# Patient Record
Sex: Male | Born: 1960 | Race: White | Hispanic: No | Marital: Married | Smoking: Former smoker
Health system: Southern US, Community
[De-identification: ages and names within clinical notes are randomized; demographics above are authoritative.]

## PROBLEM LIST (undated history)

## (undated) DIAGNOSIS — N200 Calculus of kidney: Secondary | ICD-10-CM

## (undated) DIAGNOSIS — J45909 Unspecified asthma, uncomplicated: Secondary | ICD-10-CM

## (undated) HISTORY — PX: CYSTOSCOPY W/ STONE MANIPULATION: SHX1427

## (undated) HISTORY — PX: ANKLE SURGERY: SHX546

---

## 1998-04-08 ENCOUNTER — Emergency Department (HOSPITAL_COMMUNITY): Admission: EM | Admit: 1998-04-08 | Discharge: 1998-04-08 | Payer: Self-pay | Admitting: Emergency Medicine

## 2001-08-04 ENCOUNTER — Encounter: Payer: Self-pay | Admitting: Emergency Medicine

## 2001-08-04 ENCOUNTER — Emergency Department (HOSPITAL_COMMUNITY): Admission: EM | Admit: 2001-08-04 | Discharge: 2001-08-04 | Payer: Self-pay | Admitting: Emergency Medicine

## 2001-08-09 ENCOUNTER — Encounter: Payer: Self-pay | Admitting: Urology

## 2001-08-09 ENCOUNTER — Ambulatory Visit (HOSPITAL_COMMUNITY): Admission: RE | Admit: 2001-08-09 | Discharge: 2001-08-09 | Payer: Self-pay | Admitting: Urology

## 2001-08-12 ENCOUNTER — Encounter: Payer: Self-pay | Admitting: Urology

## 2001-08-12 ENCOUNTER — Encounter: Admission: RE | Admit: 2001-08-12 | Discharge: 2001-08-12 | Payer: Self-pay | Admitting: Urology

## 2001-08-18 ENCOUNTER — Encounter: Payer: Self-pay | Admitting: Urology

## 2001-08-20 ENCOUNTER — Ambulatory Visit (HOSPITAL_COMMUNITY): Admission: RE | Admit: 2001-08-20 | Discharge: 2001-08-20 | Payer: Self-pay | Admitting: Urology

## 2001-08-27 ENCOUNTER — Encounter: Admission: RE | Admit: 2001-08-27 | Discharge: 2001-08-27 | Payer: Self-pay | Admitting: Urology

## 2001-08-27 ENCOUNTER — Encounter: Payer: Self-pay | Admitting: Urology

## 2007-01-11 ENCOUNTER — Emergency Department (HOSPITAL_COMMUNITY): Admission: EM | Admit: 2007-01-11 | Discharge: 2007-01-11 | Payer: Self-pay | Admitting: Emergency Medicine

## 2007-01-14 ENCOUNTER — Ambulatory Visit (HOSPITAL_BASED_OUTPATIENT_CLINIC_OR_DEPARTMENT_OTHER): Admission: RE | Admit: 2007-01-14 | Discharge: 2007-01-14 | Payer: Self-pay | Admitting: Urology

## 2007-01-15 ENCOUNTER — Emergency Department (HOSPITAL_COMMUNITY): Admission: EM | Admit: 2007-01-15 | Discharge: 2007-01-15 | Payer: Self-pay | Admitting: Emergency Medicine

## 2007-04-12 ENCOUNTER — Ambulatory Visit (HOSPITAL_COMMUNITY): Admission: RE | Admit: 2007-04-12 | Discharge: 2007-04-12 | Payer: Self-pay | Admitting: Urology

## 2008-04-25 ENCOUNTER — Emergency Department: Payer: Self-pay | Admitting: Emergency Medicine

## 2008-08-17 ENCOUNTER — Ambulatory Visit (HOSPITAL_COMMUNITY): Admission: RE | Admit: 2008-08-17 | Discharge: 2008-08-17 | Payer: Self-pay | Admitting: Orthopedic Surgery

## 2009-04-28 ENCOUNTER — Ambulatory Visit: Payer: Self-pay | Admitting: Diagnostic Radiology

## 2009-04-28 ENCOUNTER — Emergency Department (HOSPITAL_BASED_OUTPATIENT_CLINIC_OR_DEPARTMENT_OTHER): Admission: EM | Admit: 2009-04-28 | Discharge: 2009-04-28 | Payer: Self-pay | Admitting: Emergency Medicine

## 2010-06-13 ENCOUNTER — Emergency Department (HOSPITAL_BASED_OUTPATIENT_CLINIC_OR_DEPARTMENT_OTHER): Admission: EM | Admit: 2010-06-13 | Discharge: 2009-08-23 | Payer: Self-pay | Admitting: Emergency Medicine

## 2010-11-08 ENCOUNTER — Emergency Department: Payer: Self-pay | Admitting: Emergency Medicine

## 2010-11-19 NOTE — Op Note (Signed)
NAME:  Timothy Sexton, Timothy Sexton NO.:  [id number]   MEDICAL RECORD NO.:  [id number]          PATIENT TYPE:  AMB   LOCATION:  [facility]                         FACILITY:  [facility]   PHYSICIAN:  Jamison Neighbor, M.D.  DATE OF BIRTH:  [birth year]   DATE OF PROCEDURE:  01/14/2007  DATE OF DISCHARGE:                               OPERATIVE REPORT   PREOPERATIVE DIAGNOSIS:  Distal right ureteral calculus.   POSTOPERATIVE DIAGNOSIS:  Passed right ureteral calculus.   PROCEDURE:  Cystoscopy, right retrograde and right ureteroscopy.   SURGEON:  Jamison Neighbor, M.D.   ANESTHESIA:  General.   COMPLICATIONS:  None.   DRAIN:  None.   BRIEF HISTORY:  This 50 year old male and was found to have pain on the  right-hand side and plain x-rays showed a stone in the right kidney but  no evidence of stone in the ureter, however, the patient's pain seemed  to be well in excess of what one would expected from lower pole  calculus, so a CT scan was ordered.  The CT scan did have some partial  obstruction on the right-hand side due to a distal ureteral calculus.  The patient stated he felt a little bit better and had passed some  gravel but he was concerned that the stone did not seem to have  completely passed.  The patient is now to undergo diagnostic cystoscopy,  retrogrades and ureteroscopy.  He understands the risks and benefits of  the procedure and gave full informed consent.   PROCEDURE:  After successful induction of general anesthesia the patient  was placed in the dorsal lithotomy position, prepped with Betadine and  draped in the usual sterile fashion.  Cystoscopy was performed, urethra  visualized in its entirety and found to be normal.  Beyond the  verumontanum, the patient's prostatic fossa was opened with no signs  obstruction.  The bladder was carefully inspected, both ureteral  orifices were normal in configuration and location.  No tumors or stones  could be seen.  A 6-French  ureteral catheter was inserted to the right  ureter in preparation for a retrograde studies.   Right retrograde ureteropyelography showed a somewhat tight distal  ureter and then opened up to a moderately dilated ureter.  There were no  filling defects seen in the distal ureter.  The ureter widened out to  otherwise normal looking collecting system with minimal hydronephrosis  and no filling defects seen.  The patient had a guidewire placed and the  ureteral catheters removed.   Upon completion of the diagnostic retrograde a ureteroscope was inserted  along the ureter and was passed up to the proximal level of the SI  joint.  The more proximal ureter could be easily visualized in and there  was no signs of any obstruction in this area.  It appeared the patient  most likely have passed all of the stone material.  The ureter was  visualized carefully as the ureteroscope was withdrawn and no stone  material, stricture, obstruction could be seen.  It was not felt that  the patient would require a stent.  The ureteroscope was removed.  The  bladder was drained.  The  patient tolerated procedure well, was taken to the recovery room  condition.  He will be sent out with pain medication and also take his  Flomax as needed.  We will offer him the possibly of a 24-hour urine  study.  He will also be given the option of having the right kidney  treated with ESWL.      Jamison Neighbor, M.D.  Electronically Signed     RJE/MEDQ  D:  01/14/2007  T:  01/15/2007  Job:  [id number]

## 2010-11-19 NOTE — Op Note (Signed)
NAME:  JHACE, FENNELL NO.:  [id number]   MEDICAL RECORD NO.:  [id number]          PATIENT TYPE:  AMB   LOCATION:  DAY                          FACILITY:  [facility]   PHYSICIAN:  Jamison Neighbor, M.D.  DATE OF BIRTH:  Aug 12, 1960   DATE OF PROCEDURE:  04/12/2007  DATE OF DISCHARGE:                               OPERATIVE REPORT   PREOPERATIVE DIAGNOSIS:  Left ureteral calculus.   POSTOPERATIVE DIAGNOSIS:  Left ureteral calculus.   PROCEDURE:  Cystoscopy, left retrograde, left ureteroscopy, left basket  extraction.   SURGEON:  Jamison Neighbor, M.D.   ANESTHESIA:  General.   COMPLICATIONS:  None.   DRAINS:  None.   BRIEF HISTORY:  This 49 year old male presented to the office today with  left-sided flank pain.  A KUB was obtained, but it was not a clear-cut  stone identified.  He did, however, appear to have clear-cut pain and  marked hematuria and is now to undergo retrograde studies, ureteroscopy  and basket extraction.  He understands the risks and benefits of the  procedure and gave full informed consent.   PROCEDURE:  After successful induction of general anesthesia, the  patient was placed in the dorsal position, prepped with Betadine and  draped in the usual sterile fashion.  Cystoscopy was performed.  Urethra  was visualized in its entirety and was found to be normal.  Beyond the  verumontanum, there was some prostatic enlargement including a little  bit of the median lobe extending into the bladder.  The bladder was  carefully inspected.  No tumors or stones could be seen.  Both ureteral  orifices were normal in configuration and location.  No irregularities  of the mucosa could be identified.  A ureteral catheter was inserted  into the left ureteral orifice and a retrograde study was performed.   Retrograde ureteral pyelography was performed through a 6-French  ureteral catheter.  Contrast was seen to fill the distal ureter, which  was normal.  At  the junction of the middle and distal third of the  ureter, a filling defect could be identified; above that, there was  moderate dilation of the ureter all way up into the kidney.  There were  no other filling defects seen and no other irregularities detected.  The  guidewire was passed up to the kidney following completion of the  retrograde.   After completion of the retrograde study, the ureteroscope was inserted.  This was advanced under direct vision until the stone could be  identified.  The stone was grasped with the basket and extracted under  direct vision.  The stone will be sent for analysis.  The ureteroscope  was reintroduced and passed up to the area and beyond.  There was no  evidence of any mucosal injury or significant irregularities of the  mucosa and there was no evidence of ureteral damage.  It was felt that  the patient did not require a stent.  The urethroscope was removed.  The  patient tolerated the procedure well and was taken to the recovery room  in good condition.  Jamison Neighbor, M.D.  Electronically Signed     RJE/MEDQ  D:  04/12/2007  T:  04/13/2007  Job:  [id number]

## 2010-11-22 NOTE — Op Note (Signed)
[facility]  Patient:    Timothy Sexton, Timothy Sexton Visit Number: [id number] MRN: [id number]          Service Type: DSU Location: DAY Attending Physician:  Londell Moh Dictated by:   Jamison Neighbor, M.D. Proc. Date: 08/20/01 Admit Date:  08/20/2001   CC:         Dr. ________________                           Operative Report  PREOPERATIVE DIAGNOSIS:  Left ureteral calculus.  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  PROCEDURE:  Cystoscopy left retrograde, left ureteroscopy including flexible evaluation of upper tract, left double J catheter insertion.  SURGEON:  Jamison Neighbor, M.D.  ANESTHESIA:  General.  COMPLICATIONS:  None.  DRAINS:  A 7 French double J catheter.  BRIEF HISTORY:  This 50 year old male was found on CT scan to have a calcification in the ureter directly over the SI joint. The patient had several KUBs which did not show much in the way of stone movement. The patient has still had persistent pain and is now to undergo ureteroscopic extraction with possible in situ laser lithotripsy if indicated. The patient understands the risks and benefits of the procedure and gave full and informed consent.  DESCRIPTION OF PROCEDURE:  After successful induction of general anesthesia, the patient was placed in the dorsal lithotomy position and prepped with Betadine, draped in the usual sterile fashion. Cystoscopy was performed and the bladder was carefully inspected and was free of any tumor or stones. Both ureteral orifices were normal in configuration and location. The urethra itself was unremarkable. The prostate was minimally enlarged with a little bit of median lobe enlargement but no particular lateral lobe hypertrophy. A retrograde study performed on the left hand side showed what appeared to be some narrowing of the ureter in the area where the stone was supposed to be seen but no  obvious filling defect could be seen. A guidewire was passed up to the kidney. The distal ureter was dilated with a balloon dilator. The ureteroscope was inserted and that area was inspected. No stone could be seen. The ureteroscope could not be passed much beyond the distal ureter because of the iliac obstruction. Additional balloon dilation was performed, the ureteroscope was advanced along its entire length but the stone could not be seen. There was narrowing of the ureter in that area and it was felt that this was consistent with the location where the calcification had been seen, and it was felt that perhaps a stone had eroded out of the ureter and had been overgrown and developed into a stone granuloma. The decision was made to perform flexible ureteroscopy. Flexible ureteroscopy was done through the sheath and the entire upper tract was evaluated and no stone could be seen. The ureter was visualized in its entirety as that was withdrawn and the stone was not visualized. The double J catheter was passed up to the kidney and allowed to coil normally in the bladder and in the renal pelvis. The bladder was drained, a Urojet was inserted. A B&O suppository was inserted. The patient tolerated the procedure well and was taken to the recovery room in good condition. Dictated by:   Jamison Neighbor, M.D. Attending Physician:  Londell Moh DD:  08/20/01 TD:  08/20/01 Job: [id number]

## 2011-04-22 LAB — URINALYSIS, ROUTINE W REFLEX MICROSCOPIC
Ketones, ur: NEGATIVE
Nitrite: NEGATIVE
Nitrite: NEGATIVE
Protein, ur: 100 — AB
Specific Gravity, Urine: 1.028
Urobilinogen, UA: 0.2
Urobilinogen, UA: 1

## 2011-04-22 LAB — URINE MICROSCOPIC-ADD ON

## 2011-04-22 LAB — POCT HEMOGLOBIN-HEMACUE
Hemoglobin: 16.6
Operator id: 114531

## 2012-07-06 ENCOUNTER — Emergency Department (HOSPITAL_BASED_OUTPATIENT_CLINIC_OR_DEPARTMENT_OTHER): Payer: BC Managed Care – PPO

## 2012-07-06 ENCOUNTER — Encounter (HOSPITAL_BASED_OUTPATIENT_CLINIC_OR_DEPARTMENT_OTHER): Payer: Self-pay | Admitting: *Deleted

## 2012-07-06 ENCOUNTER — Observation Stay (HOSPITAL_BASED_OUTPATIENT_CLINIC_OR_DEPARTMENT_OTHER)
Admission: EM | Admit: 2012-07-06 | Discharge: 2012-07-07 | Disposition: A | Discharge status: Home / Self Care | Payer: BC Managed Care – PPO | Attending: Internal Medicine | Admitting: Internal Medicine

## 2012-07-06 DIAGNOSIS — R059 Cough, unspecified: Secondary | ICD-10-CM | POA: Insufficient documentation

## 2012-07-06 DIAGNOSIS — R000 Tachycardia, unspecified: Secondary | ICD-10-CM

## 2012-07-06 DIAGNOSIS — J45909 Unspecified asthma, uncomplicated: Secondary | ICD-10-CM | POA: Insufficient documentation

## 2012-07-06 DIAGNOSIS — R062 Wheezing: Secondary | ICD-10-CM | POA: Insufficient documentation

## 2012-07-06 DIAGNOSIS — J40 Bronchitis, not specified as acute or chronic: Secondary | ICD-10-CM | POA: Insufficient documentation

## 2012-07-06 DIAGNOSIS — Z87442 Personal history of urinary calculi: Secondary | ICD-10-CM

## 2012-07-06 DIAGNOSIS — R05 Cough: Secondary | ICD-10-CM | POA: Insufficient documentation

## 2012-07-06 DIAGNOSIS — R079 Chest pain, unspecified: Secondary | ICD-10-CM

## 2012-07-06 DIAGNOSIS — J101 Influenza due to other identified influenza virus with other respiratory manifestations: Principal | ICD-10-CM | POA: Insufficient documentation

## 2012-07-06 HISTORY — DX: Calculus of kidney: N20.0

## 2012-07-06 HISTORY — DX: Unspecified asthma, uncomplicated: J45.909

## 2012-07-06 LAB — COMPREHENSIVE METABOLIC PANEL
AST: 19 U/L (ref 0–37)
CO2: 23 mEq/L (ref 19–32)
Chloride: 102 mEq/L (ref 96–112)
Glucose, Bld: 113 mg/dL — ABNORMAL HIGH (ref 70–99)
Potassium: 3.8 mEq/L (ref 3.5–5.1)
Sodium: 139 mEq/L (ref 135–145)
Total Protein: 7.9 g/dL (ref 6.0–8.3)

## 2012-07-06 LAB — CBC WITH DIFFERENTIAL/PLATELET
Basophils Absolute: 0 10*3/uL (ref 0.0–0.1)
Basophils Relative: 0 % (ref 0–1)
Eosinophils Absolute: 0.1 10*3/uL (ref 0.0–0.7)
Eosinophils Relative: 1 % (ref 0–5)
Lymphocytes Relative: 15 % (ref 12–46)
MCHC: 33.8 g/dL (ref 30.0–36.0)
MCV: 85.5 fL (ref 78.0–100.0)
Monocytes Relative: 9 % (ref 3–12)
Neutrophils Relative %: 75 % (ref 43–77)
Platelets: 159 10*3/uL (ref 150–400)
RDW: 14.3 % (ref 11.5–15.5)

## 2012-07-06 LAB — RAPID STREP SCREEN (MED CTR MEBANE ONLY): Streptococcus, Group A Screen (Direct): NEGATIVE

## 2012-07-06 LAB — D-DIMER, QUANTITATIVE: D-Dimer, Quant: 0.36 ug/mL-FEU (ref 0.00–0.48)

## 2012-07-06 MED ORDER — PREDNISONE 50 MG PO TABS
60.0000 mg | ORAL_TABLET | Freq: Once | ORAL | Status: AC
  Administered 2012-07-06: 60 mg via ORAL
  Filled 2012-07-06: qty 1

## 2012-07-06 MED ORDER — PREDNISONE 50 MG PO TABS: 60.0000 mg | ORAL_TABLET | Freq: Every day | ORAL | Status: DC

## 2012-07-06 MED ORDER — ACETAMINOPHEN 325 MG PO TABS
650.0000 mg | ORAL_TABLET | Freq: Once | ORAL | Status: AC
  Administered 2012-07-06: 650 mg via ORAL
  Filled 2012-07-06: qty 2

## 2012-07-06 MED ORDER — ALBUTEROL SULFATE (5 MG/ML) 0.5% IN NEBU
5.0000 mg | INHALATION_SOLUTION | Freq: Once | RESPIRATORY_TRACT | Status: AC
  Administered 2012-07-06: 5 mg via RESPIRATORY_TRACT
  Filled 2012-07-06: qty 1

## 2012-07-06 MED ORDER — AZITHROMYCIN 250 MG PO TABS: ORAL_TABLET | ORAL | Status: DC

## 2012-07-06 MED ORDER — PREDNISONE 10 MG PO TABS: ORAL_TABLET | ORAL | Status: DC

## 2012-07-06 MED ORDER — IPRATROPIUM BROMIDE 0.02 % IN SOLN
0.5000 mg | Freq: Once | RESPIRATORY_TRACT | Status: AC
  Administered 2012-07-06: 0.5 mg via RESPIRATORY_TRACT
  Filled 2012-07-06: qty 2.5

## 2012-07-06 MED ORDER — AZITHROMYCIN 250 MG PO TABS
500.0000 mg | ORAL_TABLET | Freq: Once | ORAL | Status: AC
  Administered 2012-07-06: 500 mg via ORAL
  Filled 2012-07-06: qty 2

## 2012-07-06 NOTE — ED Notes (Signed)
Neb treatment inprogress pt feeling better denies pain

## 2012-07-06 NOTE — ED Notes (Signed)
Pt with cough congestion fever and generalized body aches

## 2012-07-06 NOTE — ED Provider Notes (Signed)
History     CSN: [id number]  Arrival date & time 07/06/12  1719   First MD Initiated Contact with Patient 07/06/12 1942      Chief Complaint  Patient presents with  . Cough  . Fever    (Consider location/radiation/quality/duration/timing/severity/associated sxs/prior treatment) Patient is a 51 y.o. male presenting with cough. The history is provided by the patient. No language interpreter was used.  Cough This is a new problem. Episode onset: 4 days ago. The problem occurs constantly. The problem has been gradually worsening. There has been no fever. Pertinent negatives include no sore throat. He has tried nothing for the symptoms. The treatment provided no relief. He is not a smoker. His past medical history is significant for asthma. His past medical history does not include pneumonia.  Pt complains of a cough and congestion.   Pt reports he had asthma.   Pt reports was around someone who was coughing.    Past Medical History  Diagnosis Date  . Asthma   . Renal disorder     Past Surgical History  Procedure Date  . Ankle surgery     History reviewed. No pertinent family history.  History  Substance Use Topics  . Smoking status: Never Smoker   . Smokeless tobacco: Current User    Types: Chew  . Alcohol Use: No      Review of Systems  HENT: Negative for sore throat.   Respiratory: Positive for cough.   All other systems reviewed and are negative.    Allergies  Niaspan and Percocet  Home Medications   Current Outpatient Rx  Name  Route  Sig  Dispense  Refill  . IPRATROPIUM-ALBUTEROL 18-103 MCG/ACT IN AERO   Inhalation   Inhale 2 puffs into the lungs every 6 (six) hours as needed.         . BUDESONIDE-FORMOTEROL FUMARATE 80-4.5 MCG/ACT IN AERO   Inhalation   Inhale 2 puffs into the lungs 2 (two) times daily.         . CELECOXIB 200 MG PO CAPS   Oral   Take 200 mg by mouth 2 (two) times daily.         Marland Kitchen OMEPRAZOLE 40 MG PO CPDR   Oral   Take  40 mg by mouth daily.           BP 157/104  Pulse 122  Temp 99.9 F (37.7 C) (Oral)  Resp 24  SpO2 97%  Physical Exam  Nursing note and vitals reviewed. Constitutional: He is oriented to person, place, and time. He appears well-developed and well-nourished.  HENT:  Head: Normocephalic and atraumatic.  Right Ear: External ear normal.  Left Ear: External ear normal.  Nose: Nose normal.  Mouth/Throat: Oropharynx is clear and moist.  Eyes: Pupils are equal, round, and reactive to light.  Neck: Normal range of motion.  Cardiovascular: Regular rhythm.        tachy  Pulmonary/Chest: Effort normal. He has wheezes.       rhonchi  Abdominal: Soft. Bowel sounds are normal.  Musculoskeletal: Normal range of motion.  Neurological: He is alert and oriented to person, place, and time.  Skin: Skin is warm.  Psychiatric: He has a normal mood and affect.    ED Course  Procedures (including critical care time)   Labs Reviewed  RAPID STREP SCREEN   Dg Chest 2 View  07/06/2012  *RADIOLOGY REPORT*  Clinical Data: Fever with cough and congestion.  CHEST - 2 VIEW  Comparison: None.  Findings: The lungs are clear without focal consolidation, edema, effusion or pneumothorax.  Cardiopericardial silhouette is within normal limits for size.  Imaged bony structures of the thorax are intact.  IMPRESSION: No acute findings.   Original Report Authenticated By: Kennith Center, M.D.      1. Bronchitis       MDM  Pt given albuterol, atrovent neb,   Prednisone and zithromax here.   Pt had minimal relief after first nebulizer, Temp 100.9    Pt given 2nd albuterol neb,  Pt's lung clear however heart rate increased to 140's-150's.   Pt monitored,  Pt reports some chest tightness.   Labs  Troponin negative, ddimer normal.   Pt's heart rate has continued to be elevated to 120's resting.   Pt ambulated and heart rate increases to 140 and 02 decreased to 120's   Dr. Fredderick Phenix in to see and examine pt    Date:  07/06/2012  Rate: 143  Rhythm: sinus tachycardia  QRS Axis: normal  Intervals: normal  ST/T Wave abnormalities: normal  Conduction Disutrbances:none  Narrative Interpretation:   Old EKG Reviewed: unchanged     Elson Areas, PA 07/06/12 2056  Lonia Skinner [place], [place] 07/06/12 2335  Lonia Skinner [place], [place] 07/06/12 [phone]

## 2012-07-07 ENCOUNTER — Encounter (HOSPITAL_COMMUNITY): Payer: Self-pay | Admitting: *Deleted

## 2012-07-07 DIAGNOSIS — Z87442 Personal history of urinary calculi: Secondary | ICD-10-CM

## 2012-07-07 DIAGNOSIS — R079 Chest pain, unspecified: Secondary | ICD-10-CM

## 2012-07-07 DIAGNOSIS — J45909 Unspecified asthma, uncomplicated: Secondary | ICD-10-CM

## 2012-07-07 DIAGNOSIS — R000 Tachycardia, unspecified: Secondary | ICD-10-CM

## 2012-07-07 DIAGNOSIS — J40 Bronchitis, not specified as acute or chronic: Secondary | ICD-10-CM

## 2012-07-07 DIAGNOSIS — J101 Influenza due to other identified influenza virus with other respiratory manifestations: Principal | ICD-10-CM

## 2012-07-07 LAB — TROPONIN I
Troponin I: <0.3 ng/mL (ref <0.30)
Troponin I: <0.3 ng/mL (ref <0.30)

## 2012-07-07 LAB — LIPID PANEL
Cholesterol: 183 mg/dL (ref 0–200)
HDL: 45 mg/dL (ref >39)
Total CHOL/HDL Ratio: 4.1 RATIO
Triglycerides: 46 mg/dL (ref <150)

## 2012-07-07 LAB — CBC
HCT: 47 % (ref 39.0–52.0)
RDW: 13.8 % (ref 11.5–15.5)
WBC: 6.6 10*3/uL (ref 4.0–10.5)

## 2012-07-07 LAB — BASIC METABOLIC PANEL
BUN: 17 mg/dL (ref 6–23)
Chloride: 103 mEq/L (ref 96–112)
GFR calc Af Amer: >90 mL/min (ref >90)
Potassium: 4.1 mEq/L (ref 3.5–5.1)
Sodium: 140 mEq/L (ref 135–145)

## 2012-07-07 LAB — INFLUENZA PANEL BY PCR (TYPE A & B): Influenza B By PCR: NEGATIVE

## 2012-07-07 MED ORDER — POTASSIUM CHLORIDE IN NACL 20-0.9 MEQ/L-% IV SOLN
INTRAVENOUS | Status: DC
  Administered 2012-07-07: 04:00:00 via INTRAVENOUS
  Filled 2012-07-07 (×3): qty 1000

## 2012-07-07 MED ORDER — ACETAMINOPHEN 325 MG PO TABS: 650.0000 mg | ORAL_TABLET | Freq: Four times a day (QID) | ORAL | Status: DC | PRN

## 2012-07-07 MED ORDER — BENZONATATE 100 MG PO CAPS: 100.0000 mg | ORAL_CAPSULE | Freq: Three times a day (TID) | ORAL | Status: DC | PRN

## 2012-07-07 MED ORDER — BENZONATATE 100 MG PO CAPS
100.0000 mg | ORAL_CAPSULE | Freq: Three times a day (TID) | ORAL | Status: DC | PRN
  Administered 2012-07-07: 100 mg via ORAL
  Filled 2012-07-07 (×2): qty 1

## 2012-07-07 MED ORDER — ENOXAPARIN SODIUM 40 MG/0.4ML ~~LOC~~ SOLN
40.0000 mg | SUBCUTANEOUS | Status: DC
  Filled 2012-07-07 (×2): qty 0.4

## 2012-07-07 MED ORDER — SODIUM CHLORIDE 0.9 % IJ SOLN: 3.0000 mL | Freq: Two times a day (BID) | INTRAMUSCULAR | Status: DC

## 2012-07-07 MED ORDER — PANTOPRAZOLE SODIUM 40 MG PO TBEC
40.0000 mg | DELAYED_RELEASE_TABLET | Freq: Every day | ORAL | Status: DC
  Administered 2012-07-07: 40 mg via ORAL
  Filled 2012-07-07: qty 1

## 2012-07-07 MED ORDER — ACETAMINOPHEN 650 MG RE SUPP: 650.0000 mg | Freq: Four times a day (QID) | RECTAL | Status: DC | PRN

## 2012-07-07 MED ORDER — SENNOSIDES-DOCUSATE SODIUM 8.6-50 MG PO TABS
1.0000 | ORAL_TABLET | Freq: Every evening | ORAL | Status: DC | PRN
  Filled 2012-07-07: qty 1

## 2012-07-07 MED ORDER — ALUM & MAG HYDROXIDE-SIMETH 200-200-20 MG/5ML PO SUSP: 30.0000 mL | Freq: Four times a day (QID) | ORAL | Status: DC | PRN

## 2012-07-07 MED ORDER — NITROGLYCERIN 0.4 MG SL SUBL: 0.4000 mg | SUBLINGUAL_TABLET | SUBLINGUAL | Status: DC | PRN

## 2012-07-07 MED ORDER — ONDANSETRON HCL 4 MG/2ML IJ SOLN: 4.0000 mg | Freq: Four times a day (QID) | INTRAMUSCULAR | Status: DC | PRN

## 2012-07-07 MED ORDER — OSELTAMIVIR PHOSPHATE 75 MG PO CAPS: 75.0000 mg | ORAL_CAPSULE | Freq: Two times a day (BID) | ORAL | Status: DC

## 2012-07-07 MED ORDER — OSELTAMIVIR PHOSPHATE 75 MG PO CAPS
75.0000 mg | ORAL_CAPSULE | Freq: Two times a day (BID) | ORAL | Status: DC
  Administered 2012-07-07: 75 mg via ORAL
  Filled 2012-07-07 (×2): qty 1

## 2012-07-07 MED ORDER — IPRATROPIUM BROMIDE 0.02 % IN SOLN: 0.5000 mg | RESPIRATORY_TRACT | Status: DC | PRN

## 2012-07-07 MED ORDER — ASPIRIN EC 81 MG PO TBEC
81.0000 mg | DELAYED_RELEASE_TABLET | Freq: Every day | ORAL | Status: DC
  Administered 2012-07-07: 81 mg via ORAL
  Filled 2012-07-07: qty 1

## 2012-07-07 MED ORDER — ONDANSETRON HCL 4 MG PO TABS: 4.0000 mg | ORAL_TABLET | Freq: Four times a day (QID) | ORAL | Status: DC | PRN

## 2012-07-07 MED ORDER — PREDNISONE 20 MG PO TABS
40.0000 mg | ORAL_TABLET | Freq: Every day | ORAL | Status: DC
  Administered 2012-07-07: 40 mg via ORAL
  Filled 2012-07-07 (×2): qty 2

## 2012-07-07 NOTE — ED Notes (Signed)
Report called to [facility] [id number] at [facility]

## 2012-07-07 NOTE — Discharge Summary (Addendum)
Physician Discharge Summary  Patient ID: Timothy Sexton MRN: [id number] DOB/AGE: 1961/02/20 52 y.o.  Admit date: 07/06/2012 Discharge date: 07/07/2012  Primary Care Physician:  No primary provider on file.   Discharge Diagnoses:    Principal Problem:  *Influenza A (H1N1) Active Problems:  Tachycardia  Bronchitis  Asthma  History of nephrolithiasis  Chest pain      Medication List     As of 07/07/2012  3:14 PM    TAKE these medications         albuterol-ipratropium 18-103 MCG/ACT inhaler   Commonly known as: COMBIVENT   Inhale 2 puffs into the lungs every 6 (six) hours as needed.      benzonatate 100 MG capsule   Commonly known as: TESSALON   Take 1 capsule (100 mg total) by mouth 3 (three) times daily as needed for cough.      budesonide-formoterol 80-4.5 MCG/ACT inhaler   Commonly known as: SYMBICORT   Inhale 2 puffs into the lungs 2 (two) times daily.      celecoxib 200 MG capsule   Commonly known as: CELEBREX   Take 200 mg by mouth 2 (two) times daily.      ibuprofen 200 MG tablet   Commonly known as: ADVIL,MOTRIN   Take 200 mg by mouth every 6 (six) hours as needed. For low grade fever.      NYQUIL PO   Take 10 mLs by mouth daily as needed.      omeprazole 40 MG capsule   Commonly known as: PRILOSEC   Take 40 mg by mouth daily.      oseltamivir 75 MG capsule   Commonly known as: TAMIFLU   Take 1 capsule (75 mg total) by mouth 2 (two) times daily.          Disposition and Follow-up:  Will be discharged home today in stable and improved condition.  Consults:  None   Significant Diagnostic Studies:  Dg Chest 2 View  07/06/2012  *RADIOLOGY REPORT*  Clinical Data: Fever with cough and congestion.  CHEST - 2 VIEW  Comparison: None.  Findings: The lungs are clear without focal consolidation, edema, effusion or pneumothorax.  Cardiopericardial silhouette is within normal limits for size.  Imaged bony structures of the thorax are intact.   IMPRESSION: No acute findings.   Original Report Authenticated By: Kennith Center, M.D.     Brief H and P: For complete details please refer to admission H and P, but in brief patient is a 52 y/o gentle man who three days ago developed head congestion, rhinorrhea and a low grade fever. Today the fever became worse, he beame SOB, started wheezing. He had a cough productive of greenish phlegm. No myalgia. He became lightheaded and dizzy. His family took him to [facility] ER. The patient has a baseline tachycardia with heart rate of ~100 being monitored by his PCP. When he received the nebulizer treatments, he became very tachycardic with heart rates of 150's. He developed left sided chest pain that radiated down left arm. This improved once heart rate was better. A request for admission to observation status was requested. Patient has no cardiac history and no significant cardiac risk factors.History provided by patient and family who are at bedside. We were asked to admit him for further evaluation and management.     Hospital Course:  Principal Problem:  *Influenza A (H1N1) Active Problems:  Tachycardia  Bronchitis  Asthma  History of nephrolithiasis  Chest pain  INfluenza -Advised to take tamiflu BID for 5 days. -Symptomatic treatment at home.  Tachycardia -Suspect related to albuterol given in the ED. -Has completely resolved at time of DC.  Rest of chronic conditions have been stable and his home medications have not been altered.   Time spent on Discharge: Greater than 30 minutes.  SignedChaya Jan [facility] Pager: [phone] 07/07/2012, 3:14 PM

## 2012-07-07 NOTE — H&P (Signed)
PCP:   Dr Cyndia Bent  Chief Complaint:  SOB  HPI: This is a 52 y/o gentle man who three days ago developed head congestion, rhinorrhea and a low grade fever. Today the fever became worse, he beame SOB, started wheezing. He had a cough productive of greenish phlegm. No myalgia. He became lightheaded and dizzy. His family took him to [facility] ER. The patient has a baseline tachycardia with heart rate of ~100 being monitored by his PCP. When he received the nebulizer treatments, he became very tachycardic with heart rates of 150's. He developed left sided chest pain that radiated down left arm. This improved once heart rate was better. A request for admission to observation status was requested. Patient has no cardiac history and no significant cardiac risk factors.History provided by patient and family who are at bedside.  Review of Systems:  The patient denies anorexia, fever, weight loss, vision loss, decreased hearing, hoarseness, syncope, dyspnea on exertion, peripheral edema, balance deficits, hemoptysis, abdominal pain, melena, hematochezia, severe indigestion/heartburn, hematuria, incontinence, genital sores, muscle weakness, suspicious skin lesions, transient blindness, difficulty walking, depression, unusual weight change, abnormal bleeding, enlarged lymph nodes, angioedema, and breast masses.  Past Medical History: Past Medical History  Diagnosis Date  . Asthma   . Nephrolithiasis    Past Surgical History  Procedure Date  . Ankle surgery   . Cystoscopy w/ stone manipulation     Medications: Prior to Admission medications   Medication Sig Start Date End Date Taking? Authorizing Provider  albuterol-ipratropium (COMBIVENT) 18-103 MCG/ACT inhaler Inhale 2 puffs into the lungs every 6 (six) hours as needed.   Yes Historical Provider, MD  budesonide-formoterol (SYMBICORT) 80-4.5 MCG/ACT inhaler Inhale 2 puffs into the lungs 2 (two) times daily.   Yes Historical Provider, MD    celecoxib (CELEBREX) 200 MG capsule Take 200 mg by mouth 2 (two) times daily.   Yes Historical Provider, MD  omeprazole (PRILOSEC) 40 MG capsule Take 40 mg by mouth daily.   Yes Historical Provider, MD  azithromycin (ZITHROMAX) 250 MG tablet 1 every day until finished. 07/06/12   Elson Areas, PA  predniSONE (DELTASONE) 10 MG tablet 5,4,3,2,1 taper 07/06/12   Elson Areas, PA    Allergies:   Allergies  Allergen Reactions  . Niaspan (Niacin Er)   . Percocet (Oxycodone-Acetaminophen)     Social History:  reports that he quit smoking about 12 years ago. His smokeless tobacco use includes Chew. He reports that he does not drink alcohol or use illicit drugs.  Family History: Diabetes, COPD  Physical Exam: Filed Vitals:   07/06/12 2148 07/06/12 2152 07/06/12 2154 07/07/12 0145  BP:    146/96  Pulse: 138 138 133 101  Temp:    97.7 F (36.5 C)  TempSrc:    Oral  Resp: 14 20 15 18   Height:    6\' 1"  (1.854 m)  Weight:    115.3 kg (254 lb 3.1 oz)  SpO2: 98% 99% 100% 97%    General:  Alert and oriented times three, well developed and nourished, no acute distress Eyes: PERRLA, pink conjunctiva, no scleral icterus ENT: Moist oral mucosa, neck supple, no thyromegaly Lungs: clear to ascultation, no wheeze, no crackles, no use of accessory muscles Cardiovascular: regular rate and rhythm, no regurgitation, no gallops, no murmurs. No carotid bruits, no JVD Abdomen: soft, positive BS, non-tender, non-distended, no organomegaly, not an acute abdomen GU: not examined Neuro: CN II - XII grossly intact, sensation intact Musculoskeletal: strength 5/5  all extremities, no clubbing, cyanosis or edema Skin: no rash, no subcutaneous crepitation, no decubitus Psych: appropriate patient   Labs on Admission:   [facility] 07/06/12 2143  NA 139  K 3.8  CL 102  CO2 23  GLUCOSE 113*  BUN 18  CREATININE 1.00  CALCIUM 9.9  MG --  PHOS --    Basename 07/06/12 2143  AST 19  ALT 22   ALKPHOS 89  BILITOT 0.5  PROT 7.9  ALBUMIN 4.2   No results found for this basename: LIPASE:2,AMYLASE:2 in the last 72 hours  Basename 07/06/12 2143  WBC 6.9  NEUTROABS 5.1  HGB 16.9  HCT 50.0  MCV 85.5  PLT 159    Basename 07/06/12 2143  CKTOTAL --  CKMB --  CKMBINDEX --  TROPONINI <0.30   No components found with this basename: POCBNP:3  Basename 07/06/12 2143  DDIMER 0.36    Micro Results: Recent Results (from the past 240 hour(s))  RAPID STREP SCREEN     Status: Normal   Collection Time   07/06/12  7:47 PM      Component Value Range Status Comment   Streptococcus, Group A Screen (Direct) NEGATIVE  NEGATIVE Final      Radiological Exams on Admission: Dg Chest 2 View  07/06/2012  *RADIOLOGY REPORT*  Clinical Data: Fever with cough and congestion.  CHEST - 2 VIEW  Comparison: None.  Findings: The lungs are clear without focal consolidation, edema, effusion or pneumothorax.  Cardiopericardial silhouette is within normal limits for size.  Imaged bony structures of the thorax are intact.  IMPRESSION: No acute findings.   Original Report Authenticated By: Kennith Center, M.D.    EKG: NSR  Assessment/Plan Present on Admission:  Chest pain Tachycardia Bring in for 23 hour observation Chest pain likely d/t tachycardia. Resolved with tachycardia Will cycle cardiac enzymes, baby ASA ordered, lipid panel Nitro PRN Bronchitis Atrovent, tessalon pearles PRN Prednisone oral  H/o nephrolithiais  Full code DVTprophylaxis  Timothy Sexton 07/07/2012, 2:43 AM

## 2012-07-07 NOTE — ED Provider Notes (Signed)
Medical screening examination/treatment/procedure(s) were conducted as a shared visit with non-physician practitioner(s) and myself.  I personally evaluated the patient during the encounter  Pt with bronchitis symptoms, SOB, wheezing.  His breathing has improved, but still tachypnic and tachycardic.  Nothing else to suggest PE.  Will admit for obs  Rolan Bucco, MD 07/07/12 [phone]

## 2017-10-11 ENCOUNTER — Other Ambulatory Visit: Payer: Self-pay

## 2017-10-11 ENCOUNTER — Encounter (HOSPITAL_COMMUNITY): Payer: Self-pay

## 2017-10-11 ENCOUNTER — Emergency Department (HOSPITAL_COMMUNITY)
Admission: EM | Admit: 2017-10-11 | Discharge: 2017-10-11 | Disposition: A | Discharge status: Home / Self Care | Payer: BLUE CROSS/BLUE SHIELD | Attending: Emergency Medicine | Admitting: Emergency Medicine

## 2017-10-11 ENCOUNTER — Emergency Department (HOSPITAL_COMMUNITY): Payer: BLUE CROSS/BLUE SHIELD

## 2017-10-11 DIAGNOSIS — Z79899 Other long term (current) drug therapy: Secondary | ICD-10-CM | POA: Diagnosis not present

## 2017-10-11 DIAGNOSIS — Z87891 Personal history of nicotine dependence: Secondary | ICD-10-CM | POA: Insufficient documentation

## 2017-10-11 DIAGNOSIS — J45909 Unspecified asthma, uncomplicated: Secondary | ICD-10-CM | POA: Insufficient documentation

## 2017-10-11 DIAGNOSIS — R1011 Right upper quadrant pain: Secondary | ICD-10-CM | POA: Diagnosis present

## 2017-10-11 DIAGNOSIS — N200 Calculus of kidney: Secondary | ICD-10-CM | POA: Diagnosis not present

## 2017-10-11 LAB — CBC WITH DIFFERENTIAL/PLATELET
BASOS ABS: 0 10*3/uL (ref 0.0–0.1)
BASOS PCT: 0 %
Eosinophils Absolute: 0.2 10*3/uL (ref 0.0–0.7)
Eosinophils Relative: 2 %
HCT: 50.4 % (ref 39.0–52.0)
Hemoglobin: 17.2 g/dL — ABNORMAL HIGH (ref 13.0–17.0)
LYMPHS PCT: 21 %
Lymphs Abs: 1.9 10*3/uL (ref 0.7–4.0)
MCH: 29.8 pg (ref 26.0–34.0)
MCHC: 34.1 g/dL (ref 30.0–36.0)
MCV: 87.3 fL (ref 78.0–100.0)
MONO ABS: 0.6 10*3/uL (ref 0.1–1.0)
Monocytes Relative: 7 %
NEUTROS PCT: 70 %
Neutro Abs: 6.3 10*3/uL (ref 1.7–7.7)
Platelets: 212 10*3/uL (ref 150–400)
RBC: 5.77 MIL/uL (ref 4.22–5.81)
RDW: 13.9 % (ref 11.5–15.5)
WBC: 9 10*3/uL (ref 4.0–10.5)

## 2017-10-11 LAB — URINALYSIS, ROUTINE W REFLEX MICROSCOPIC
Bilirubin Urine: NEGATIVE
GLUCOSE, UA: NEGATIVE mg/dL
Ketones, ur: NEGATIVE mg/dL
LEUKOCYTES UA: NEGATIVE
Nitrite: NEGATIVE
Protein, ur: 30 mg/dL — AB
pH: 6 (ref 5.0–8.0)

## 2017-10-11 LAB — I-STAT CHEM 8, ED
BUN: 22 mg/dL — ABNORMAL HIGH (ref 6–20)
CHLORIDE: 107 mmol/L (ref 101–111)
CREATININE: 1 mg/dL (ref 0.61–1.24)
Calcium, Ion: 1.26 mmol/L (ref 1.15–1.40)
Glucose, Bld: 105 mg/dL — ABNORMAL HIGH (ref 65–99)
HEMATOCRIT: 51 % (ref 39.0–52.0)
HEMOGLOBIN: 17.3 g/dL — AB (ref 13.0–17.0)
POTASSIUM: 4.2 mmol/L (ref 3.5–5.1)
Sodium: 143 mmol/L (ref 135–145)
TCO2: 24 mmol/L (ref 22–32)

## 2017-10-11 LAB — URINALYSIS, MICROSCOPIC (REFLEX)
BACTERIA UA: NONE SEEN
SQUAMOUS EPITHELIAL / LPF: NONE SEEN

## 2017-10-11 MED ORDER — TAMSULOSIN HCL 0.4 MG PO CAPS: 0.4000 mg | ORAL_CAPSULE | Freq: Every day | ORAL | 0 refills | Status: AC

## 2017-10-11 MED ORDER — ONDANSETRON HCL 4 MG/2ML IJ SOLN
4.0000 mg | Freq: Once | INTRAMUSCULAR | Status: AC
  Administered 2017-10-11: 4 mg via INTRAVENOUS
  Filled 2017-10-11: qty 2

## 2017-10-11 MED ORDER — KETOROLAC TROMETHAMINE 15 MG/ML IJ SOLN
15.0000 mg | Freq: Once | INTRAMUSCULAR | Status: AC
  Administered 2017-10-11: 15 mg via INTRAVENOUS
  Filled 2017-10-11: qty 1

## 2017-10-11 MED ORDER — HYDROMORPHONE HCL 1 MG/ML IJ SOLN
1.0000 mg | Freq: Once | INTRAMUSCULAR | Status: AC
  Administered 2017-10-11: 1 mg via INTRAVENOUS
  Filled 2017-10-11: qty 1

## 2017-10-11 MED ORDER — HYDROCODONE-ACETAMINOPHEN 5-325 MG PO TABS: 1.0000 | ORAL_TABLET | Freq: Four times a day (QID) | ORAL | 0 refills | Status: DC | PRN

## 2017-10-11 NOTE — ED Triage Notes (Signed)
He c/o right flank pain since yesterday. He states he has hx of multiple kidney stones.

## 2017-10-11 NOTE — ED Notes (Signed)
Patient transported to CT 

## 2017-10-11 NOTE — ED Notes (Signed)
Patient aware that urine sample is needed. Urinal at the bedside  

## 2017-10-11 NOTE — ED Notes (Signed)
Patient aware urine sample is needed. Urinal at bedside. 

## 2017-10-11 NOTE — ED Provider Notes (Signed)
[facility]-EMERGENCY DEPT Provider Note   CSN: [id number] Arrival date & time: 10/11/17  0950     History   Chief Complaint Chief Complaint  Patient presents with  . Flank Pain    HPI Timothy Sexton is a 57 y.o. male.  Patient is a 57 year old male with known recurrent kidney stones that has last had intervention 2-3 years ago presenting with sudden onset of severe 10 out of 10 pain in the right flank area that started last night and resolved and then started again early this morning and is been constant.  He states it is worse than his normal kidney stone pain.  The history is provided by the patient.  Flank Pain  This is a recurrent problem. The current episode started yesterday. Episode frequency: intermittent. The problem has been rapidly worsening. Associated symptoms include abdominal pain. Pertinent negatives include no chest pain, no headaches and no shortness of breath. Associated symptoms comments: Right flank pain that radiates down to the groin.  No dysuria, vomiting or fever.. Nothing aggravates the symptoms. Nothing relieves the symptoms. He has tried nothing for the symptoms. The treatment provided no relief.    Past Medical History:  Diagnosis Date  . Asthma   . Nephrolithiasis     Patient Active Problem List   Diagnosis Date Noted  . Tachycardia 07/07/2012  . Bronchitis 07/07/2012  . Asthma 07/07/2012  . History of nephrolithiasis 07/07/2012  . Chest pain 07/07/2012  . Influenza A (H1N1) 07/07/2012    Past Surgical History:  Procedure Laterality Date  . ANKLE SURGERY    . CYSTOSCOPY W/ STONE MANIPULATION          Home Medications    Prior to Admission medications   Medication Sig Start Date End Date Taking? Authorizing Provider  albuterol-ipratropium (COMBIVENT) 18-103 MCG/ACT inhaler Inhale 2 puffs into the lungs every 6 (six) hours as needed.    [provider]  benzonatate (TESSALON) 100 MG capsule Take 1  capsule (100 mg total) by mouth 3 (three) times daily as needed for cough. 07/07/12   Philip AspenHernandez Acosta, Limmie PatriciaEstela Y, MD  budesonide-formoterol [facility](SYMBICORT) 80-4.5 MCG/ACT inhaler Inhale 2 puffs into the lungs 2 (two) times daily.    [provider]  celecoxib (CELEBREX) 200 MG capsule Take 200 mg by mouth 2 (two) times daily.    [provider]  ibuprofen (ADVIL,MOTRIN) 200 MG tablet Take 200 mg by mouth every 6 (six) hours as needed. For low grade fever.    [provider]  omeprazole (PRILOSEC) 40 MG capsule Take 40 mg by mouth daily.    [provider]  oseltamivir (TAMIFLU) 75 MG capsule Take 1 capsule (75 mg total) by mouth 2 (two) times daily. 07/07/12   Philip AspenHernandez Acosta, Limmie PatriciaEstela Y, MD  Pseudoeph-Doxylamine-DM-APAP (NYQUIL PO) Take 10 mLs by mouth daily as needed.    [provider]    Family History Family History  Problem Relation Age of Onset  . COPD Unknown   . Diabetes type II Unknown     Social History Social History   Tobacco Use  . Smoking status: Former Smoker    Last attempt to quit: 07/07/2000    Years since quitting: 17.2  . Smokeless tobacco: Current User    Types: Chew  Substance Use Topics  . Alcohol use: No  . Drug use: No     Allergies   Niaspan [niacin er] and Percocet [oxycodone-acetaminophen]   Review of Systems Review of Systems  Respiratory: Negative for shortness of breath.   Cardiovascular: Negative for chest pain.  Gastrointestinal: Positive for abdominal pain.  Genitourinary: Positive for flank pain.  Neurological: Negative for headaches.  All other systems reviewed and are negative.    Physical Exam Updated Vital Signs BP (!) 144/93 (BP Location: Right Arm)   Pulse 84   Temp (!) 97.4 F (36.3 C) (Oral)   Resp 20   SpO2 100%   Physical Exam  Constitutional: He is oriented to person, place, and time. He appears well-developed and well-nourished. He appears distressed.  Appears uncomfortable    HENT:  Head: Normocephalic and atraumatic.  Mouth/Throat: Oropharynx is clear and moist.  Eyes: Pupils are equal, round, and reactive to light. Conjunctivae and EOM are normal.  Neck: Normal range of motion. Neck supple.  Cardiovascular: Normal rate, regular rhythm and intact distal pulses.  No murmur heard. Pulmonary/Chest: Effort normal and breath sounds normal. No respiratory distress. He has no wheezes. He has no rales.  Abdominal: Soft. He exhibits no distension. There is no tenderness. There is CVA tenderness. There is no rebound and no guarding.  Right CVA tenderness  Musculoskeletal: Normal range of motion. He exhibits no edema or tenderness.  Neurological: He is alert and oriented to person, place, and time.  Skin: Skin is warm and dry. No rash noted. No erythema.  Psychiatric: He has a normal mood and affect. His behavior is normal.  Nursing note and vitals reviewed.    ED Treatments / Results  Labs (all labs ordered are listed, but only abnormal results are displayed) Labs Reviewed  CBC WITH DIFFERENTIAL/PLATELET - Abnormal; Notable for the following components:      Result Value   Hemoglobin 17.2 (*)    All other components within normal limits  I-STAT CHEM 8, ED - Abnormal; Notable for the following components:   BUN 22 (*)    Glucose, Bld 105 (*)    Hemoglobin 17.3 (*)    All other components within normal limits  URINALYSIS, ROUTINE W REFLEX MICROSCOPIC    EKG None  Radiology Ct Renal Stone Study  Result Date: 10/11/2017 CLINICAL DATA:  Right flank pain. EXAM: CT ABDOMEN AND PELVIS WITHOUT CONTRAST TECHNIQUE: Multidetector CT imaging of the abdomen and pelvis was performed following the standard protocol without IV contrast. COMPARISON:  None. FINDINGS: Lower chest: No acute abnormality. Hepatobiliary: No focal liver abnormality is seen. No gallstones, gallbladder wall thickening, or biliary dilatation. Pancreas: Unremarkable. No pancreatic ductal dilatation or  surrounding inflammatory changes. Spleen: Normal in size without focal abnormality. Adrenals/Urinary Tract: Bilateral renal stones identified. Two or 3 stones are seen on the right with 2 small stones on the left. No left hydronephrosis. There is mild right hydronephrosis due to 2 adjacent stones in the proximal right ureter with the more proximal stone measuring 4.2 mm in the more distal stone measuring 4.5 mm. The ureters and bladder are otherwise normal. Stomach/Bowel: The stomach and small bowel are normal. The colon and appendix are normal. Vascular/Lymphatic: No significant vascular findings are present. No enlarged abdominal or pelvic lymph nodes. Reproductive: Prostate is unremarkable. Other: There is a fat containing left inguinal hernia. No free air or free fluid. Musculoskeletal: Degenerative changes are seen within the lumbar spine. IMPRESSION: 1. There are 2 adjacent stones in the proximal right ureter as above resulting in mild hydronephrosis. Bilateral renal stones. Electronically Signed   By: Gerome Sam III M.D   On: 10/11/2017 11:12    Procedures Procedures (including critical  care time)  Medications Ordered in ED Medications  ondansetron (ZOFRAN) injection 4 mg (4 mg Intravenous Given 10/11/17 1022)  HYDROmorphone (DILAUDID) injection 1 mg (1 mg Intravenous Given 10/11/17 1022)  ketorolac (TORADOL) 15 MG/ML injection 15 mg (15 mg Intravenous Given 10/11/17 1119)     Initial Impression / Assessment and Plan / ED Course  I have reviewed the triage vital signs and the nursing notes.  Pertinent labs & imaging results that were available during my care of the patient were reviewed by me and considered in my medical decision making (see chart for details).     Pt with symptoms consistent with kidney stone.  Denies infectious sx, or GI symptoms.  Low concern for diverticulitis and no  history suggestive of AAA.  No hx suggestive of GU source (discharge).  Will hydrate, treat pain and  ensure no infection with UA, CBC, CMP and will get stone study to further eval.  11:30 AM I-STAT with normal hemoglobin, CBC with normal white count, CT shows 2 proximal right renal stones 4 mm each with hydronephrosis.  On repeat evaluation patient is in pain and was given Toradol.  Will recheck and ensure patient's pain is controlled and will discharge home with pain medication and Flomax.  12:35 PM Patient's pain is now a 2 out of 10 and he is requesting discharge home.   Final Clinical Impressions(s) / ED Diagnoses   Final diagnoses:  Kidney stone    ED Discharge Orders        Ordered    tamsulosin (FLOMAX) 0.4 MG CAPS capsule  Daily after supper     10/11/17 1233    HYDROcodone-acetaminophen (NORCO/VICODIN) 5-325 MG tablet  Every 6 hours PRN     10/11/17 1233       Gwyneth Sprout, MD 10/11/17 1418

## 2018-08-14 IMAGING — CT CT RENAL STONE PROTOCOL
2 of 4 series · 15 of 46 positions shown, 17 images · non-contrast
Comparison: None.

CLINICAL DATA: Right flank pain.

EXAM:
CT ABDOMEN AND PELVIS WITHOUT CONTRAST
TECHNIQUE: Multidetector CT imaging of the abdomen and pelvis was performed
following the standard protocol without IV contrast.

[Series 2: axial st · axial · 0.83mm/px · z∈[+930,+1455]mm · 12 of 117 slices shown, 14 images]
[im 6/117  soft-tissue]
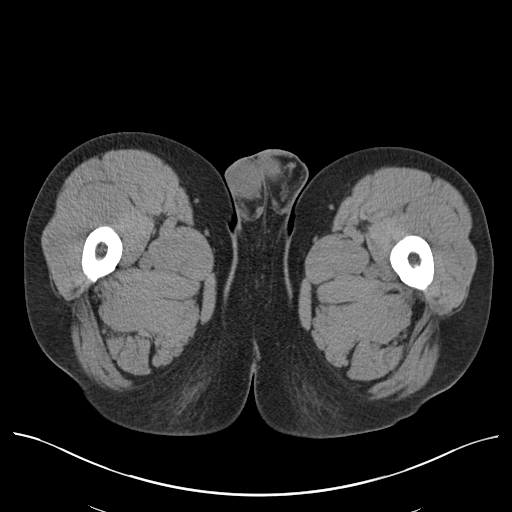
[im 6/117  bone]
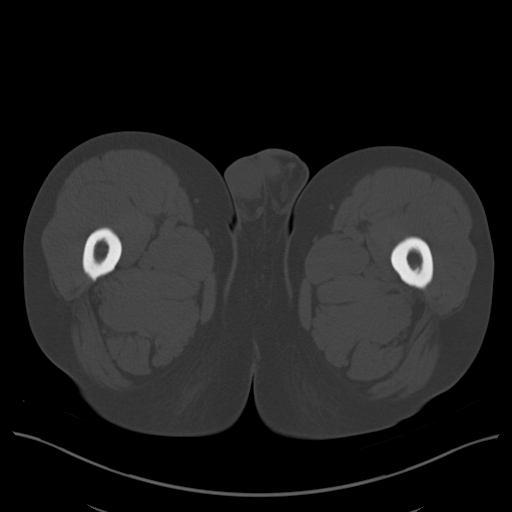
[im 18/117  soft-tissue]
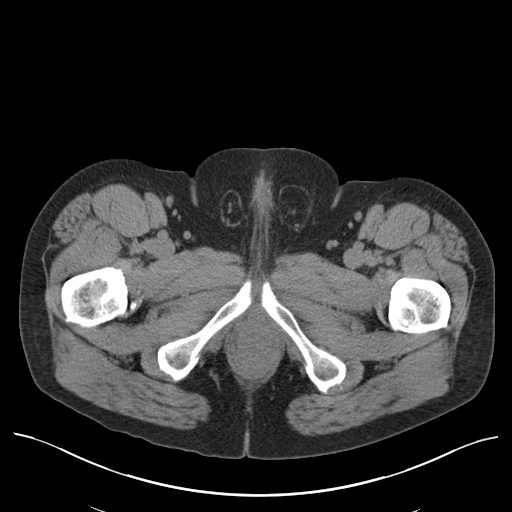
[im 24/117  soft-tissue]
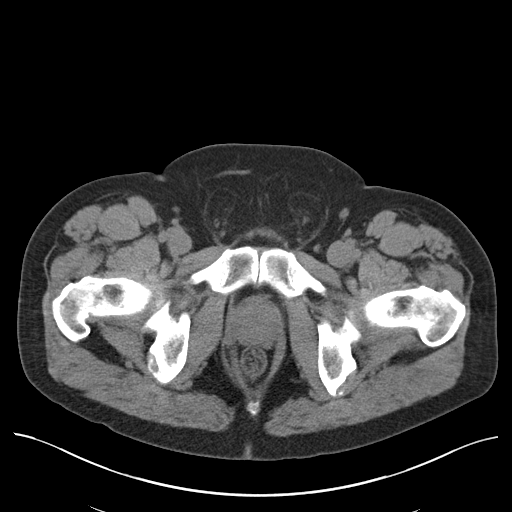
[im 35/117  soft-tissue]
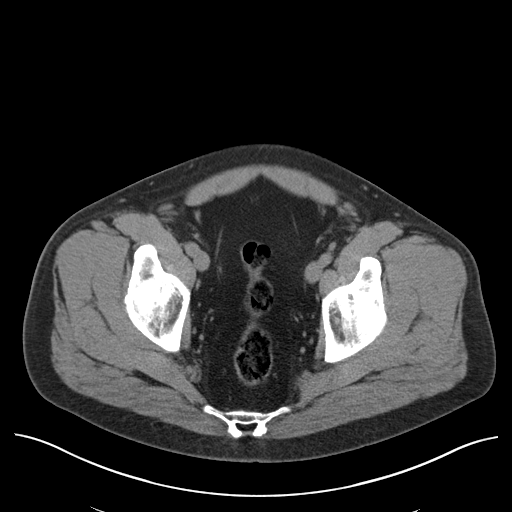
[im 47/117  soft-tissue]
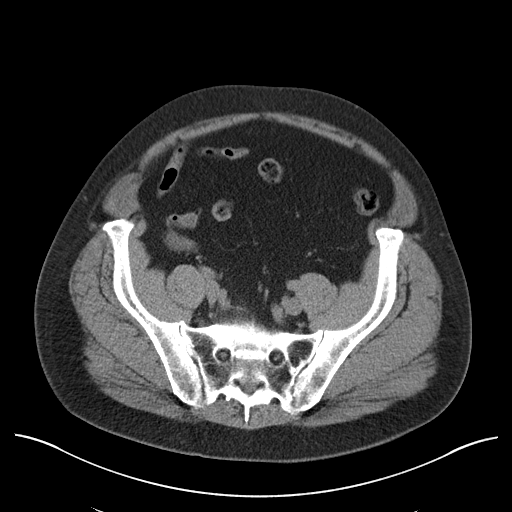
[im 53/117  soft-tissue]
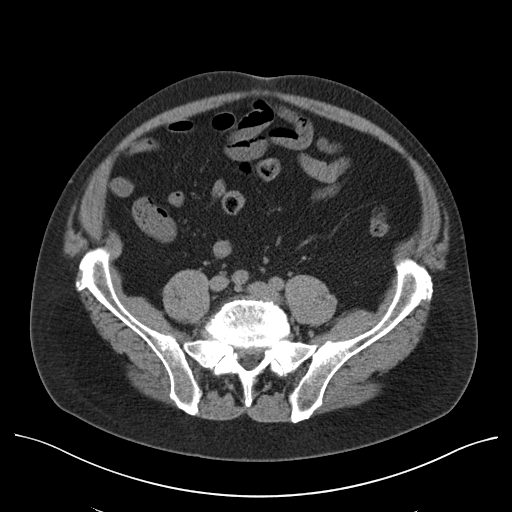
[im 64/117  soft-tissue]
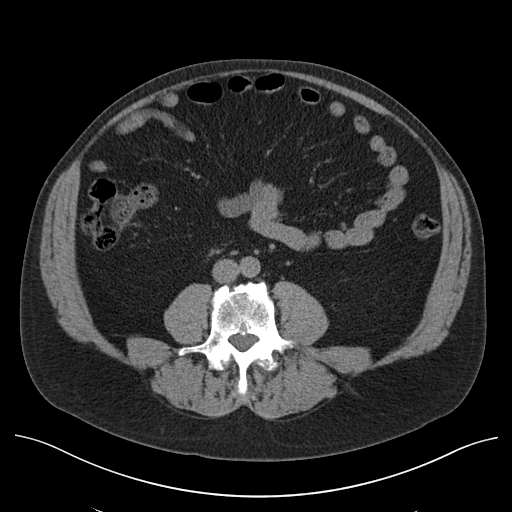
[im 70/117  soft-tissue]
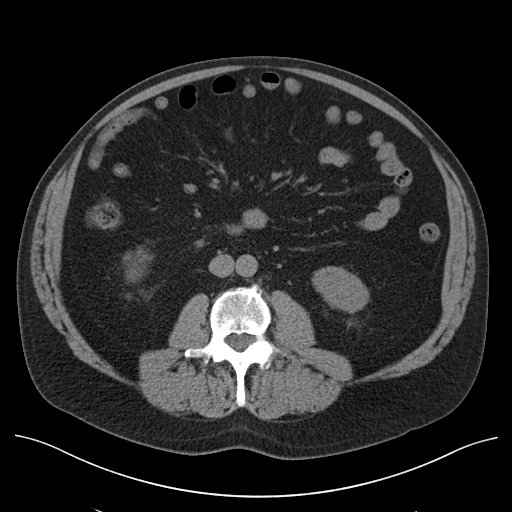
[im 82/117  soft-tissue]
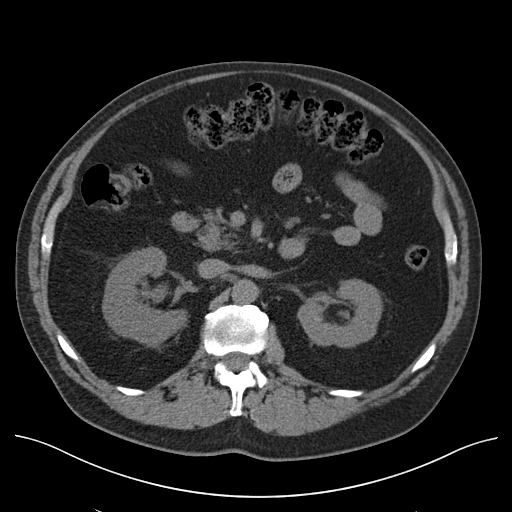
[im 82/117  bone]
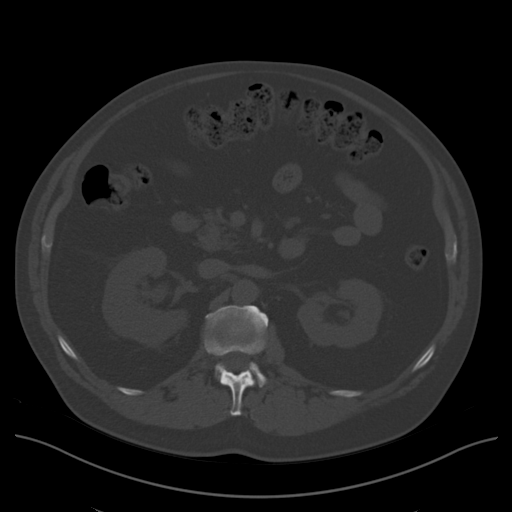
[im 93/117  soft-tissue]
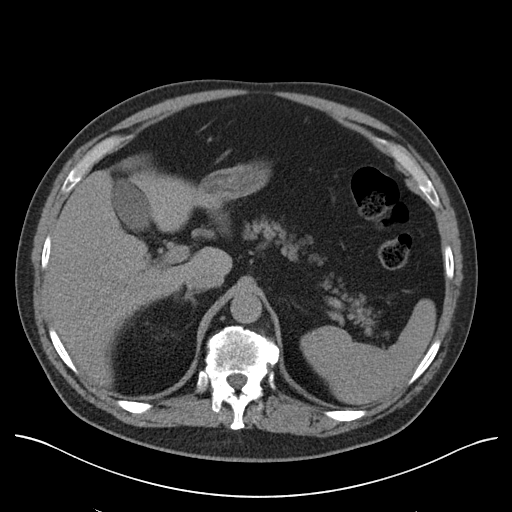
[im 99/117  soft-tissue]
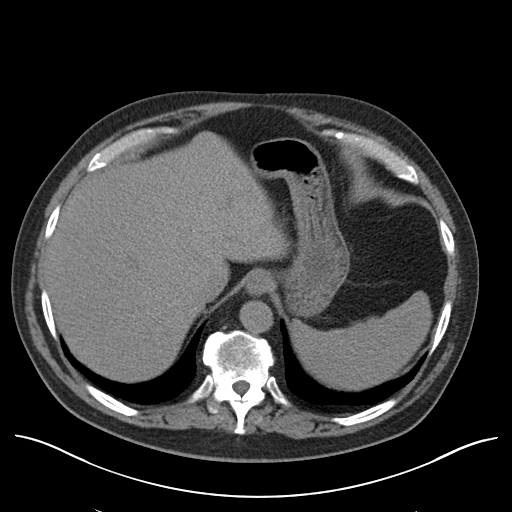
[im 111/117  soft-tissue]
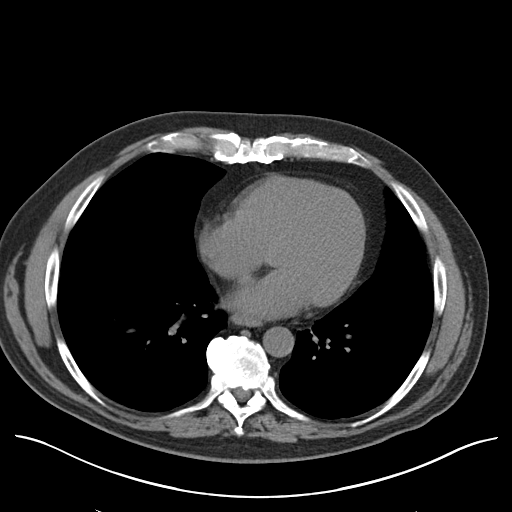

[Series 4: coronal · coronal · 0.74mm/px · 3 of 153 slices shown]
[im 51/153  soft-tissue]
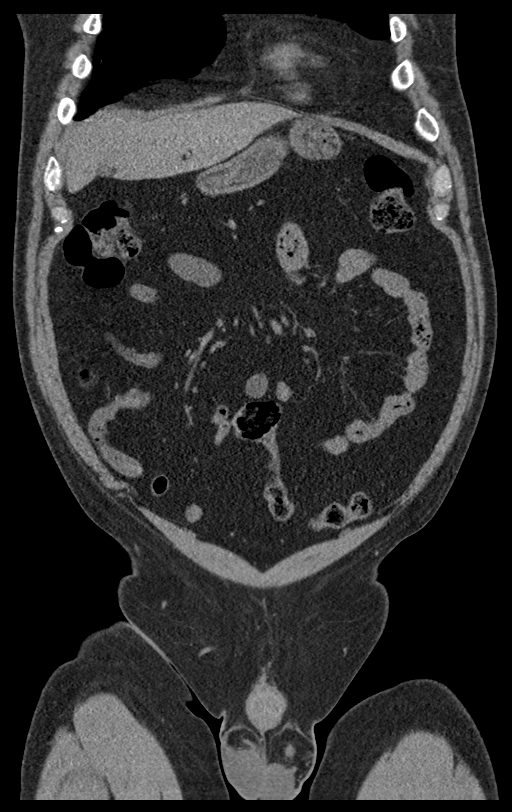
[im 68/153  soft-tissue]
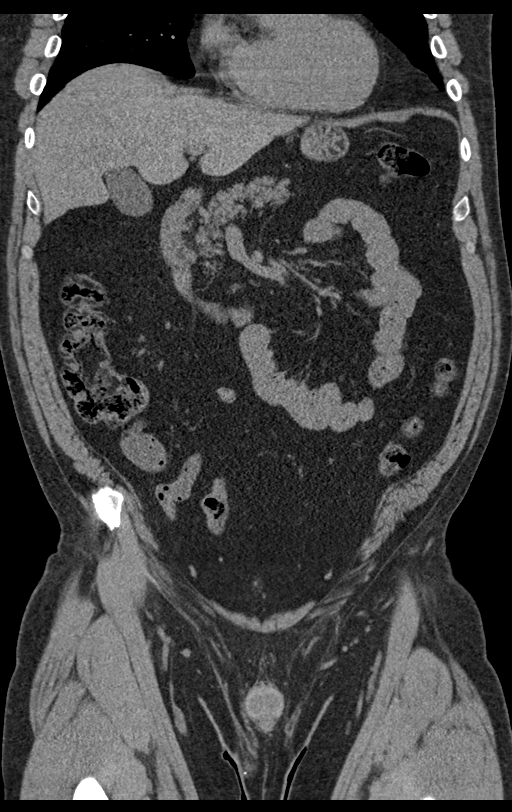
[im 85/153  soft-tissue]
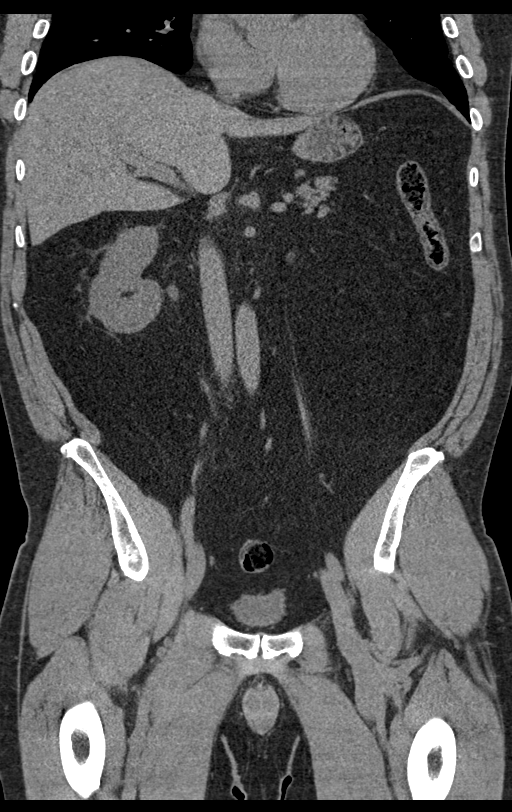

[15 of 46 positions shown; findings below may reference images not displayed]

FINDINGS: Lower chest: No acute abnormality.

Hepatobiliary: No focal liver abnormality is seen. No gallstones,
gallbladder wall thickening, or biliary dilatation.

Pancreas: Unremarkable. No pancreatic ductal dilatation or
surrounding inflammatory changes.

Spleen: Normal in size without focal abnormality.

Adrenals/Urinary Tract: Bilateral renal stones identified. Two or 3
stones are seen on the right with 2 small stones on the left. No
left hydronephrosis. There is mild right hydronephrosis due to 2
adjacent stones in the proximal right ureter with the more proximal
stone measuring 4.2 mm in the more distal stone measuring 4.5 mm.
The ureters and bladder are otherwise normal.

Stomach/Bowel: The stomach and small bowel are normal. The colon and
appendix are normal.

Vascular/Lymphatic: No significant vascular findings are present. No
enlarged abdominal or pelvic lymph nodes.

Reproductive: Prostate is unremarkable.

Other: There is a fat containing left inguinal hernia. No free air
or free fluid.

Musculoskeletal: Degenerative changes are seen within the lumbar
spine.
IMPRESSION: 1. There are 2 adjacent stones in the proximal right ureter as above
resulting in mild hydronephrosis. Bilateral renal stones.

## 2023-12-10 ENCOUNTER — Emergency Department (HOSPITAL_COMMUNITY)

## 2023-12-10 ENCOUNTER — Emergency Department (HOSPITAL_COMMUNITY)
Admission: EM | Admit: 2023-12-10 | Discharge: 2023-12-10 | Disposition: A | Discharge status: Home / Self Care | Attending: Emergency Medicine | Admitting: Emergency Medicine

## 2023-12-10 ENCOUNTER — Other Ambulatory Visit: Payer: Self-pay

## 2023-12-10 DIAGNOSIS — S50312A Abrasion of left elbow, initial encounter: Secondary | ICD-10-CM | POA: Diagnosis not present

## 2023-12-10 DIAGNOSIS — W108XXA Fall (on) (from) other stairs and steps, initial encounter: Secondary | ICD-10-CM | POA: Diagnosis not present

## 2023-12-10 DIAGNOSIS — M549 Dorsalgia, unspecified: Secondary | ICD-10-CM | POA: Diagnosis present

## 2023-12-10 DIAGNOSIS — W19XXXA Unspecified fall, initial encounter: Secondary | ICD-10-CM

## 2023-12-10 DIAGNOSIS — S29012A Strain of muscle and tendon of back wall of thorax, initial encounter: Secondary | ICD-10-CM | POA: Insufficient documentation

## 2023-12-10 LAB — COMPREHENSIVE METABOLIC PANEL WITH GFR
ALT: 21 U/L (ref 0–44)
AST: 23 U/L (ref 15–41)
Albumin: 4.1 g/dL (ref 3.5–5.0)
Alkaline Phosphatase: 62 U/L (ref 38–126)
Anion gap: 11 (ref 5–15)
BUN: 17 mg/dL (ref 8–23)
CO2: 24 mmol/L (ref 22–32)
Calcium: 10.4 mg/dL — ABNORMAL HIGH (ref 8.9–10.3)
Chloride: 103 mmol/L (ref 98–111)
Creatinine, Ser: 1.11 mg/dL (ref 0.61–1.24)
GFR, Estimated: >60 mL/min (ref >60)
Glucose, Bld: 118 mg/dL — ABNORMAL HIGH (ref 70–99)
Potassium: 4.1 mmol/L (ref 3.5–5.1)
Sodium: 138 mmol/L (ref 135–145)
Total Bilirubin: 0.9 mg/dL (ref 0.0–1.2)
Total Protein: 7.3 g/dL (ref 6.5–8.1)

## 2023-12-10 LAB — CBC
HCT: 49.6 % (ref 39.0–52.0)
Hemoglobin: 16.3 g/dL (ref 13.0–17.0)
MCH: 28.8 pg (ref 26.0–34.0)
MCHC: 32.9 g/dL (ref 30.0–36.0)
MCV: 87.6 fL (ref 80.0–100.0)
Platelets: 239 10*3/uL (ref 150–400)
RBC: 5.66 MIL/uL (ref 4.22–5.81)
RDW: 13.2 % (ref 11.5–15.5)
WBC: 8.8 10*3/uL (ref 4.0–10.5)
nRBC: 0 % (ref 0.0–0.2)

## 2023-12-10 LAB — ETHANOL: Alcohol, Ethyl (B): <15 mg/dL (ref <15)

## 2023-12-10 LAB — I-STAT CHEM 8, ED
BUN: 24 mg/dL — ABNORMAL HIGH (ref 8–23)
Calcium, Ion: 1.1 mmol/L — ABNORMAL LOW (ref 1.15–1.40)
Chloride: 107 mmol/L (ref 98–111)
Creatinine, Ser: 1 mg/dL (ref 0.61–1.24)
Glucose, Bld: 112 mg/dL — ABNORMAL HIGH (ref 70–99)
HCT: 47 % (ref 39.0–52.0)
Hemoglobin: 16 g/dL (ref 13.0–17.0)
Potassium: 4.8 mmol/L (ref 3.5–5.1)
Sodium: 137 mmol/L (ref 135–145)
TCO2: 25 mmol/L (ref 22–32)

## 2023-12-10 LAB — SAMPLE TO BLOOD BANK

## 2023-12-10 LAB — I-STAT CG4 LACTIC ACID, ED: Lactic Acid, Venous: 2.4 mmol/L (ref 0.5–1.9)

## 2023-12-10 LAB — PROTIME-INR
INR: 1 (ref 0.8–1.2)
Prothrombin Time: 13.5 s (ref 11.4–15.2)

## 2023-12-10 MED ORDER — HYDROMORPHONE HCL 1 MG/ML IJ SOLN
1.0000 mg | Freq: Once | INTRAMUSCULAR | Status: AC
  Administered 2023-12-10: 1 mg via INTRAVENOUS
  Filled 2023-12-10: qty 1

## 2023-12-10 MED ORDER — METHOCARBAMOL 1000 MG/10ML IJ SOLN
500.0000 mg | Freq: Once | INTRAMUSCULAR | Status: AC
  Administered 2023-12-10: 500 mg via INTRAVENOUS
  Filled 2023-12-10: qty 10

## 2023-12-10 MED ORDER — ONDANSETRON HCL 4 MG/2ML IJ SOLN
4.0000 mg | Freq: Once | INTRAMUSCULAR | Status: AC
  Administered 2023-12-10: 4 mg via INTRAVENOUS
  Filled 2023-12-10: qty 2

## 2023-12-10 MED ORDER — HYDROCODONE-ACETAMINOPHEN 5-325 MG PO TABS: 1.0000 | ORAL_TABLET | ORAL | 0 refills | Status: AC | PRN

## 2023-12-10 MED ORDER — HYDROCODONE-ACETAMINOPHEN 5-325 MG PO TABS
2.0000 | ORAL_TABLET | Freq: Once | ORAL | Status: AC
  Administered 2023-12-10: 2 via ORAL
  Filled 2023-12-10: qty 2

## 2023-12-10 MED ORDER — METHOCARBAMOL 500 MG PO TABS: 500.0000 mg | ORAL_TABLET | Freq: Three times a day (TID) | ORAL | 0 refills | Status: AC | PRN

## 2023-12-10 MED ORDER — KETOROLAC TROMETHAMINE 15 MG/ML IJ SOLN
15.0000 mg | Freq: Once | INTRAMUSCULAR | Status: AC
  Administered 2023-12-10: 15 mg via INTRAVENOUS
  Filled 2023-12-10: qty 1

## 2023-12-10 MED ORDER — IOHEXOL 350 MG/ML SOLN
75.0000 mL | Freq: Once | INTRAVENOUS | Status: AC | PRN
  Administered 2023-12-10: 75 mL via INTRAVENOUS

## 2023-12-10 NOTE — ED Triage Notes (Signed)
 Climbing out of a front end loader. Head 48ft up, 3 or 4 ft off step. Messed a step, patient landed on gravel. Patient has a lac, no decreased LOC, no thinners, recalls entire event. Intense back pain between shoulder blades-area red and pain tender on palpation. Lungs clear and equal. Patient arrives in a c collar. Left elbow lac and abrasion. Surgical hx neck procedure a few years ago.   200mcg of fentanyl 20 LFA   149/78

## 2023-12-10 NOTE — ED Notes (Signed)
 Patient discharged by RN. Patient verbalizes understanding, dc with family

## 2023-12-10 NOTE — ED Provider Notes (Signed)
 [place] EMERGENCY DEPARTMENT AT [facility] Provider Note   CSN: [id number] Arrival date & time: 12/10/23  1237     History  Chief Complaint  Patient presents with   Timothy Sexton is a 63 y.o. male.  HPI 63 year old male presents after a fall.  He was on a front end loader and missed the last step and fell about 6-8 feet and landing on his back.  He hit his head but did not lose consciousness.  He is complaining of severe pain in his right back near his scapula.  He also has a mild injury to his left elbow.  He denies any chest pain or abdominal pain or other extremity injury.  No weakness.  He is not on blood thinners.  Pain is severe.  Home Medications Prior to Admission medications   Medication Sig Start Date End Date Taking? Authorizing Provider  HYDROcodone -acetaminophen  (NORCO/VICODIN) 5-325 MG tablet Take 1 tablet by mouth every 4 (four) hours as needed. 12/10/23  Yes Jerilynn Montenegro, MD  methocarbamol (ROBAXIN) 500 MG tablet Take 1 tablet (500 mg total) by mouth every 8 (eight) hours as needed for muscle spasms. 12/10/23  Yes Jerilynn Montenegro, MD  amLODipine (NORVASC) 10 MG tablet Take 10 mg by mouth daily. 07/31/17   [provider]  budesonide-formoterol (SYMBICORT) 80-4.5 MCG/ACT inhaler Inhale 2 puffs into the lungs 2 (two) times daily.    [provider]  celecoxib (CELEBREX) 200 MG capsule Take 200 mg by mouth daily.     [provider]  diclofenac sodium (VOLTAREN) 1 % GEL Apply 1 application topically daily as needed for pain. 08/11/17   [provider]  fexofenadine (ALLEGRA) 180 MG tablet Take 180 mg by mouth daily.    [provider]  Multiple Vitamin (MULTIVITAMIN WITH MINERALS) TABS tablet Take 1 tablet by mouth daily.    [provider]  tamsulosin  (FLOMAX ) 0.4 MG CAPS capsule Take 1 capsule (0.4 mg total) by mouth daily after supper. 10/11/17   Almond Army, MD      Allergies    Niaspan  [niacin er (antihyperlipidemic)] and Percocet [oxycodone-acetaminophen ]    Review of Systems   Review of Systems  Respiratory:  Negative for shortness of breath.   Gastrointestinal:  Negative for abdominal pain.  Musculoskeletal:  Positive for back pain. Negative for neck pain.  Skin:  Positive for wound.  Neurological:  Negative for headaches.    Physical Exam Updated Vital Signs BP (!) 140/78   Pulse 76   Temp 97.6 F (36.4 C) (Oral)   Resp 14   Ht 6\' 1"  (1.854 m)   Wt 115.3 kg   SpO2 99%   BMI 33.54 kg/m  Physical Exam Vitals and nursing note reviewed.  Constitutional:      Appearance: He is well-developed. He is not diaphoretic.     Interventions: Cervical collar in place.  HENT:     Head: Normocephalic and atraumatic.  Cardiovascular:     Rate and Rhythm: Normal rate and regular rhythm.     Pulses:          Radial pulses are 2+ on the left side.     Heart sounds: Normal heart sounds.  Pulmonary:     Effort: Pulmonary effort is normal.     Breath sounds: Normal breath sounds.  Abdominal:     Palpations: Abdomen is soft.     Tenderness: There is no abdominal tenderness.  Musculoskeletal:  Left elbow: Normal range of motion. Tenderness (mild) present.     Thoracic back: Tenderness present.       Back:     Comments: There is an abrasion to the left elbow. with scattered gravel but no deep laceration appreciated.  Mild tenderness.  Normal range of motion.  Skin:    General: Skin is warm and dry.  Neurological:     Mental Status: He is alert.     ED Results / Procedures / Treatments   Labs (all labs ordered are listed, but only abnormal results are displayed) Labs Reviewed  COMPREHENSIVE METABOLIC PANEL WITH GFR - Abnormal; Notable for the following components:      Result Value   Glucose, Bld 118 (*)    Calcium 10.4 (*)    All other components within normal limits  I-STAT CHEM 8, ED - Abnormal; Notable for the following components:   BUN 24 (*)     Glucose, Bld 112 (*)    Calcium, Ion 1.10 (*)    All other components within normal limits  I-STAT CG4 LACTIC ACID, ED - Abnormal; Notable for the following components:   Lactic Acid, Venous 2.4 (*)    All other components within normal limits  CBC  ETHANOL  PROTIME-INR  URINALYSIS, ROUTINE W REFLEX MICROSCOPIC  SAMPLE TO BLOOD BANK    EKG EKG Interpretation Date/Time:  Thursday December 10 2023 12:44:47 EDT Ventricular Rate:  79 PR Interval:  159 QRS Duration:  108 QT Interval:  384 QTC Calculation: 441 R Axis:   73  Text Interpretation: Sinus rhythm no acute ST/T changes Confirmed by Jerilynn Montenegro [phone]) on 12/10/2023 3:43:04 PM  Radiology CT CHEST ABDOMEN PELVIS W CONTRAST Result Date: 12/10/2023 CLINICAL DATA:  Blunt poly trauma EXAM: CT CHEST, ABDOMEN, AND PELVIS WITH CONTRAST TECHNIQUE: Multidetector CT imaging of the chest, abdomen and pelvis was performed following the standard protocol during bolus administration of intravenous contrast. RADIATION DOSE REDUCTION: This exam was performed according to the departmental dose-optimization program which includes automated exposure control, adjustment of the mA and/or kV according to patient size and/or use of iterative reconstruction technique. CONTRAST:  75mL OMNIPAQUE IOHEXOL 350 MG/ML SOLN COMPARISON:  CT urogram June 04, 2009 FINDINGS: CT CHEST FINDINGS Cardiovascular: No significant vascular findings. Normal heart size. No pericardial effusion. Mediastinum/Nodes: No enlarged mediastinal, hilar, or axillary lymph nodes. Thyroid gland, trachea, and esophagus demonstrate no significant findings. Lungs/Pleura: Lungs are clear. No pleural effusion or pneumothorax. No pulmonary contusion no pneumothorax Musculoskeletal: No chest wall mass or suspicious bone lesions identified. CT ABDOMEN PELVIS FINDINGS Hepatobiliary: No focal liver abnormality is seen. No gallstones, gallbladder wall thickening, or biliary dilatation. Pancreas:  Unremarkable. No pancreatic ductal dilatation or surrounding inflammatory changes. Spleen: Normal in size without focal abnormality. Adrenals/Urinary Tract: Adrenal glands are unremarkable. Kidneys are normal, without renal calculi, focal lesion, or hydronephrosis. Bladder is unremarkable. Stomach/Bowel: Stomach is within normal limits. Appendix appears normal. No evidence of bowel wall thickening, distention, or inflammatory changes. Vascular/Lymphatic: No significant vascular findings are present. No enlarged abdominal or pelvic lymph nodes. Reproductive: Prostate is unremarkable. Other: Small bilateral inguinal hernias containing fat only Musculoskeletal: No acute or significant osseous findings. IMPRESSION: *No acute findings in the chest, abdomen or pelvis. *Small bilateral inguinal hernias containing fat only. Electronically Signed   By: Fredrich Jefferson M.D.   On: 12/10/2023 14:16   CT CERVICAL SPINE WO CONTRAST Result Date: 12/10/2023 CLINICAL DATA:  Fall with trauma to the head and neck EXAM: CT  CERVICAL SPINE WITHOUT CONTRAST TECHNIQUE: Multidetector CT imaging of the cervical spine was performed without intravenous contrast. Multiplanar CT image reconstructions were also generated. RADIATION DOSE REDUCTION: This exam was performed according to the departmental dose-optimization program which includes automated exposure control, adjustment of the mA and/or kV according to patient size and/or use of iterative reconstruction technique. COMPARISON:  None Available. FINDINGS: Alignment: No traumatic malalignment. 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 Posterior surgical fusion from C3 to T1. Solid union with solid bridging osteophytes. No hardware complication. Soft tissues and spinal canal: Negative Disc levels:  No canal stenosis.  No compressive foraminal stenosis. Upper chest: Negative Other: None IMPRESSION: No acute or traumatic finding. 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  Posterior surgical fusion from C3 to T1. Solid union with solid bridging osteophytes. No hardware complication. Electronically Signed   By: Bettylou Brunner M.D.   On: 12/10/2023 14:15   CT HEAD WO CONTRAST Result Date: 12/10/2023 CLINICAL DATA:  Fall with trauma to the head and neck EXAM: CT HEAD WITHOUT CONTRAST TECHNIQUE: Contiguous axial images were obtained from the base of the skull through the vertex without intravenous contrast. RADIATION DOSE REDUCTION: This exam was performed according to the departmental dose-optimization program which includes automated exposure control, adjustment of the mA and/or kV according to patient size and/or use of iterative reconstruction technique. COMPARISON:  None Available. FINDINGS: Brain: The brain shows a normal appearance without evidence of malformation, atrophy, old or acute small or large vessel infarction, mass lesion, hemorrhage, hydrocephalus or extra-axial collection. Vascular: No hyperdense vessel. No evidence of atherosclerotic calcification. Skull: Normal.  No traumatic finding.  No focal bone lesion. Sinuses/Orbits: Sinuses are clear. Orbits appear normal. Mastoids are clear. Other: None significant Electronically Signed   By: Bettylou Brunner M.D.   On: 12/10/2023 14:14   DG Elbow Complete Left Result Date: 12/10/2023 CLINICAL DATA:  Fall prior to admission.  Left elbow pain. EXAM: LEFT ELBOW - COMPLETE 3+ VIEW COMPARISON:  None Available. FINDINGS: The mineralization and alignment are normal. There is no evidence of acute fracture or dislocation. No evidence of elbow joint effusion. There is mild spurring of the medial humeral epicondyle and the coronoid process. IV tubing is present in the antecubital fossa. There are multiple small superficial foreign bodies posteriorly as well as a larger foreign body within the posterolateral soft tissues of the proximal forearm. No definite soft tissue emphysema identified. IMPRESSION: 1. No evidence of acute fracture or  dislocation. 2. Multiple soft tissue foreign bodies suspected. Correlate clinically. Electronically Signed   By: Elmon Hagedorn M.D.   On: 12/10/2023 13:57   DG Chest 1 View Result Date: 12/10/2023 CLINICAL DATA:  Fall prior to admission.  Chest pain with breathing. EXAM: CHEST  1 VIEW COMPARISON:  None recent.  Radiographs 07/06/2012. FINDINGS: 1329 hours. Interval multilevel posterior cervical fusion, incompletely visualized. The heart size and mediastinal contours are normal. The lungs are clear. There is no pleural effusion or pneumothorax. No acute osseous findings are identified. Telemetry leads overlie the chest. IMPRESSION: No evidence of acute chest injury. Interval multilevel posterior cervical fusion. Electronically Signed   By: Elmon Hagedorn M.D.   On: 12/10/2023 13:54    Procedures Procedures    Medications Ordered in ED Medications  HYDROcodone -acetaminophen  (NORCO/VICODIN) 5-325 MG per tablet 2 tablet (has no administration in time range)  HYDROmorphone  (DILAUDID ) injection 1 mg (1 mg Intravenous Given 12/10/23 1321)  ondansetron  (ZOFRAN ) injection 4 mg (  4 mg Intravenous Given 12/10/23 1404)  iohexol (OMNIPAQUE) 350 MG/ML injection 75 mL (75 mLs Intravenous Contrast Given 12/10/23 1408)  ketorolac  (TORADOL ) 15 MG/ML injection 15 mg (15 mg Intravenous Given 12/10/23 1441)  methocarbamol (ROBAXIN) injection 500 mg (500 mg Intravenous Given 12/10/23 1441)    ED Course/ Medical Decision Making/ A&P                                 Medical Decision Making Amount and/or Complexity of Data Reviewed Labs: ordered.    Details: Elevated lactate is probably from trauma.  Other labs are unremarkable. Radiology: ordered and independent interpretation performed.    Details: No pneumothorax or fracture ECG/medicine tests: ordered and independent interpretation performed.  Risk Prescription drug management.   Patient presents with a mechanical fall after falling off last step.  Has severe  back pain but fortunately his CTs are unremarkable.  No rib or scapular fracture.  Was given some Dilaudid  for pain, then given Toradol  and Robaxin.  He is feeling well enough for discharge.  Vital signs are reassuring.  He did have blood mild lactic acidosis but suspect this was more trauma related as he is otherwise feeling well.  Will discharge home with pain control but at this point, no indication for admission.  His elbow was cleaned.  Even after the x-ray I was able to find some pieces of gravel I could remove.  He would like to clean it out better while at home and prefers us  not to clean it out further here.  His Tdap is up-to-date.  Will discharge home with return precautions.        Final Clinical Impression(s) / ED Diagnoses Final diagnoses:  Fall, initial encounter  Muscle strain of right upper back, initial encounter  Abrasion of left elbow, initial encounter    Rx / DC Orders ED Discharge Orders          Ordered    methocarbamol (ROBAXIN) 500 MG tablet  Every 8 hours PRN        12/10/23 1537    HYDROcodone -acetaminophen  (NORCO/VICODIN) 5-325 MG tablet  Every 4 hours PRN        12/10/23 1537              Jerilynn Montenegro, MD 12/10/23 1549

## 2023-12-10 NOTE — Discharge Instructions (Signed)
 You can apply ice or heat to the affected area.  We are giving you a prescription for both hydrocodone  which is a pain medicine as well as Robaxin which is a muscle relaxer.  Both can cause sedation so do not take them at the same time.  You may also still take ibuprofen and Tylenol  in between, though have caution as the hydrocodone  has some Tylenol  in it.  Be sure to clean out your elbow with soap and water to remove all the gravel/debris.  If you develop new or worsening pain, numbness or weakness, or any other new/concerning symptoms the return to the ER.

## 2023-12-10 NOTE — ED Notes (Signed)
 Patient transported to X-ray
